# Patient Record
Sex: Male | Born: 2000 | Race: White | Hispanic: No | Marital: Single | Smoking: Never smoker
Health system: Southern US, Community
[De-identification: ages and names within clinical notes are randomized; demographics above are authoritative.]

## PROBLEM LIST (undated history)

## (undated) DIAGNOSIS — M419 Scoliosis, unspecified: Secondary | ICD-10-CM

---
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
## 2018-10-11 IMAGING — CT CT ABD-PELV W/ CM
2 of 4 series · 15 of 46 positions shown, 17 images · IV contrast (Omni 300)
Comparison: Ultrasounds yesterday.

CLINICAL DATA: Right lower quadrant abdominal pain.

EXAM:
CT ABDOMEN AND PELVIS WITH CONTRAST
TECHNIQUE: Multidetector CT imaging of the abdomen and pelvis was performed
using the standard protocol following bolus administration of
intravenous contrast.
CONTRAST:  100mL LXJ7I7-DJJ IOPAMIDOL (LXJ7I7-DJJ) INJECTION 61%

[Series 3: a/p w/ 5mm · axial · 0.69mm/px · z∈[-517,-92]mm · 12 of 97 slices shown, 14 images]
[im 6/97  soft-tissue]
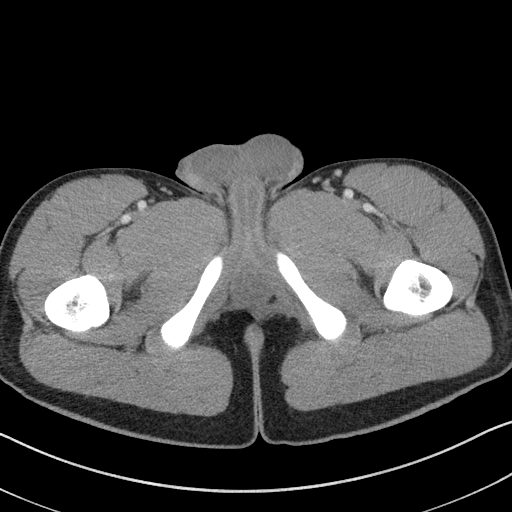
[im 6/97  bone]
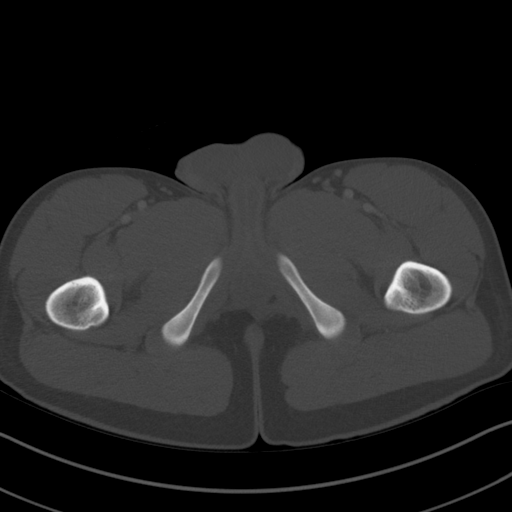
[im 16/97  soft-tissue]
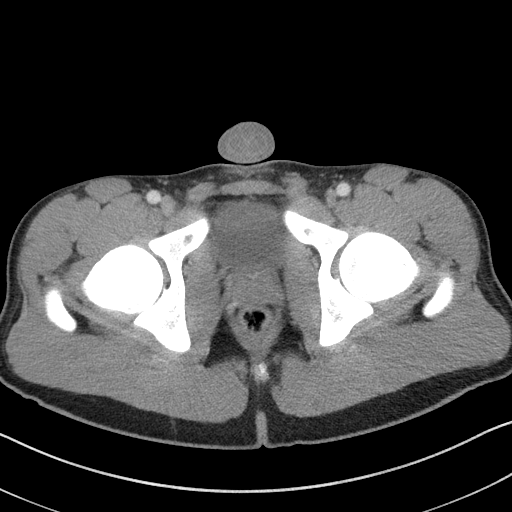
[im 21/97  soft-tissue]
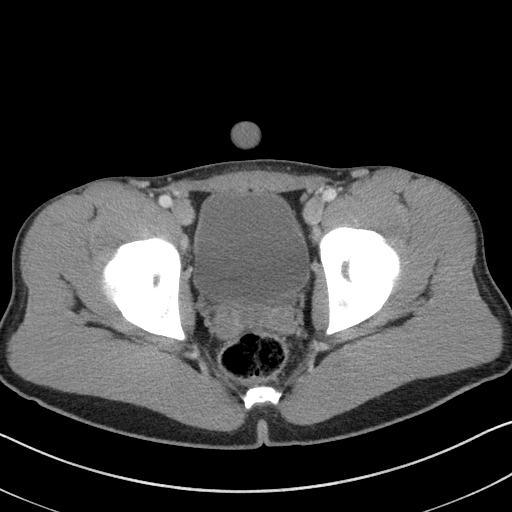
[im 31/97  soft-tissue]
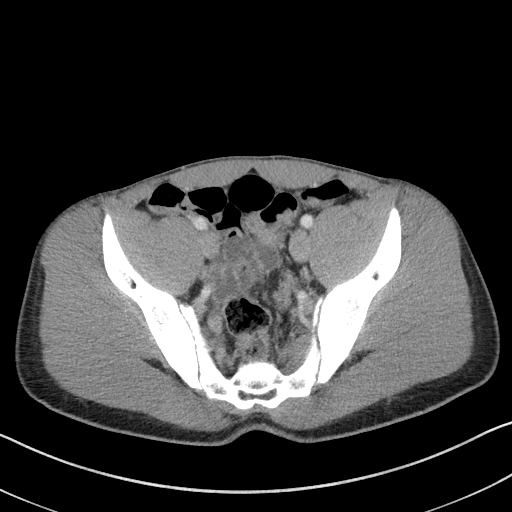
[im 36/97  soft-tissue]
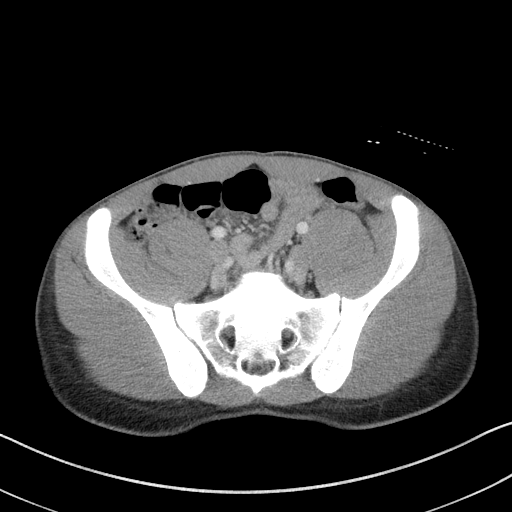
[im 46/97  soft-tissue]
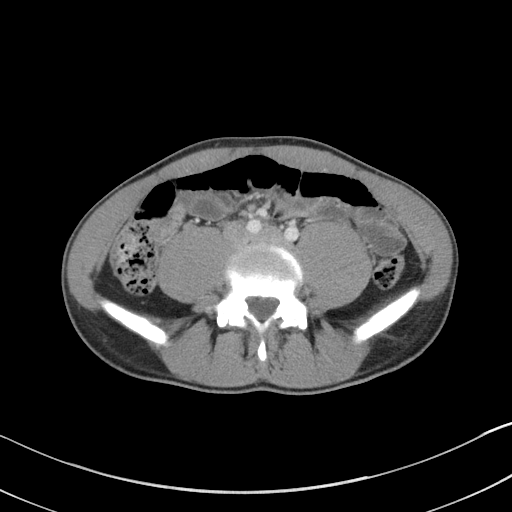
[im 51/97  soft-tissue]
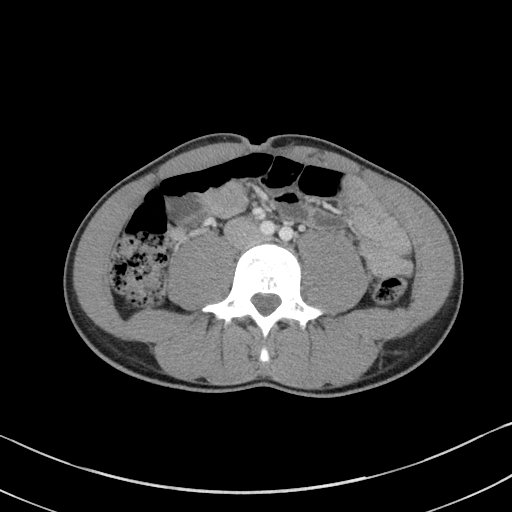
[im 61/97  soft-tissue]
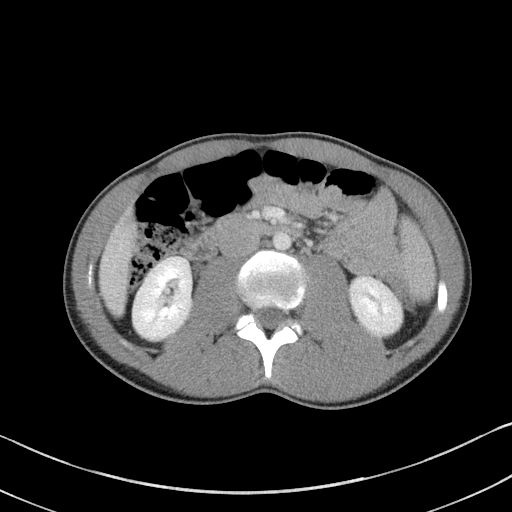
[im 66/97  soft-tissue]
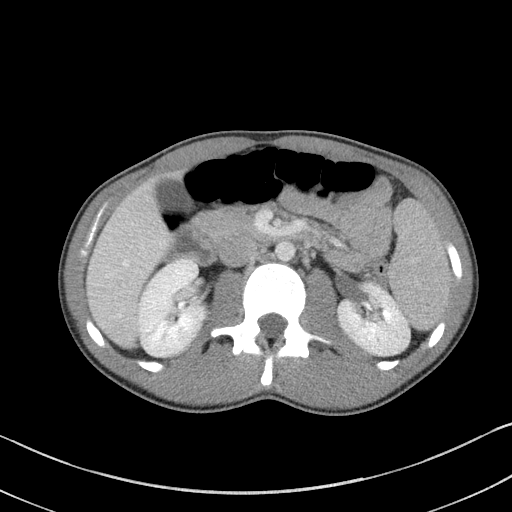
[im 66/97  bone]
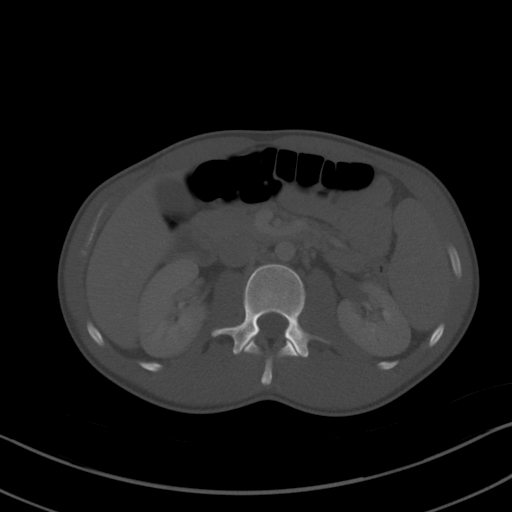
[im 76/97  soft-tissue]
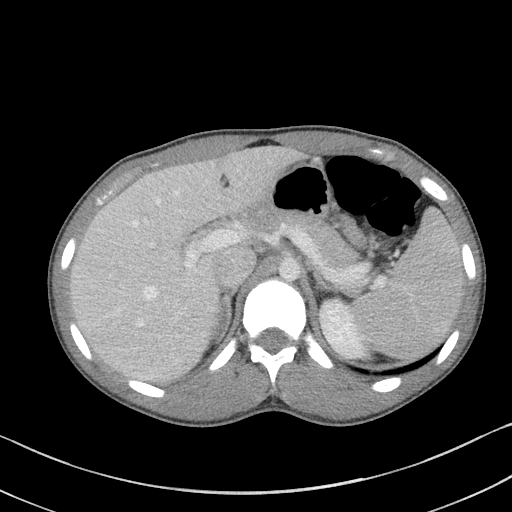
[im 81/97  soft-tissue]
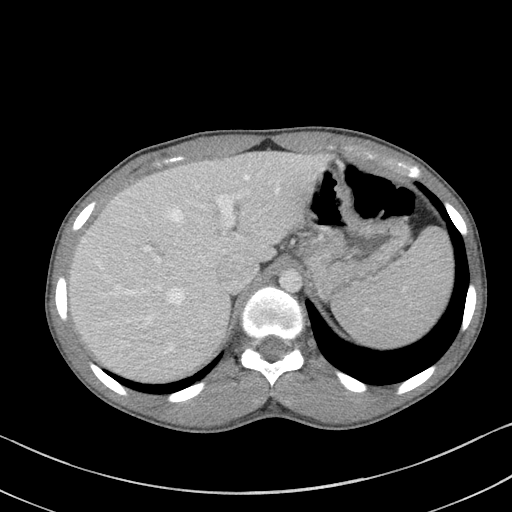
[im 91/97  soft-tissue]
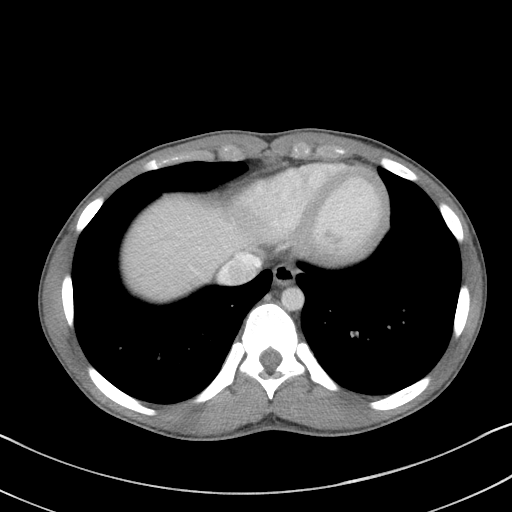

[Series 6: a/p w/ cor · coronal · 0.71mm/px · 3 of 103 slices shown]
[im 35/103  soft-tissue]
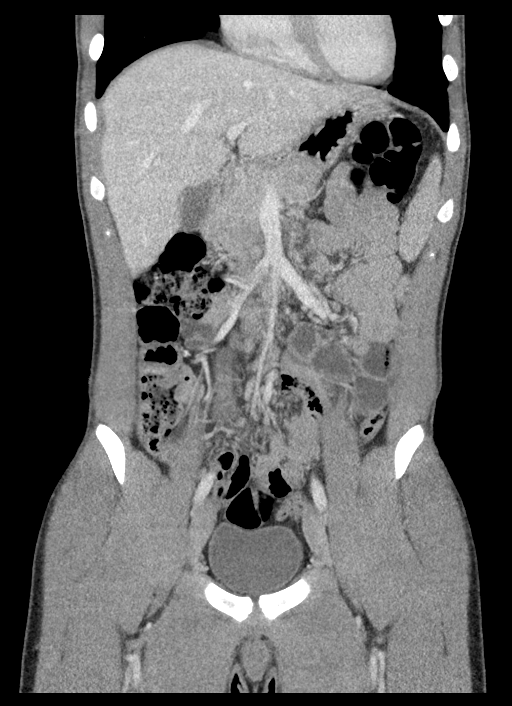
[im 46/103  soft-tissue]
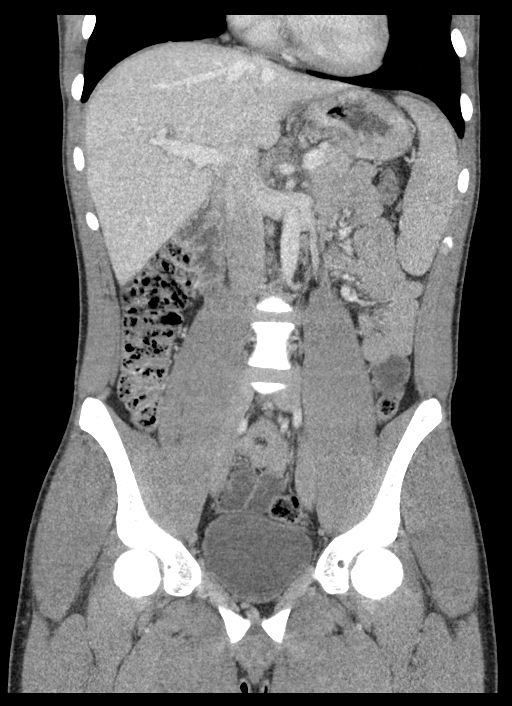
[im 57/103  soft-tissue]
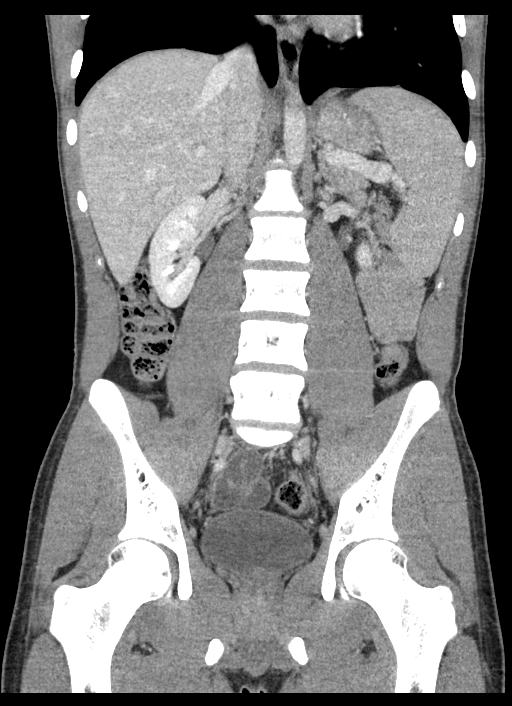

[15 of 46 positions shown; findings below may reference images not displayed]

FINDINGS: Lower chest: The lung bases are clear.

Hepatobiliary: No focal liver abnormality is seen. No gallstones,
gallbladder wall thickening, or biliary dilatation.

Pancreas: No ductal dilatation or inflammation.

Spleen: Mild splenomegaly. Spleen measures 13.9 x 5.8 x 10.7 cm
(volume = 450 cm^3). No focal lesion.

Adrenals/Urinary Tract: No adrenal nodule. No hydronephrosis or
perinephric edema. Homogeneous symmetric renal enhancement. Ureters
are decompressed. Urinary bladder is physiologically distended. No
bladder stone.

Stomach/Bowel: Stomach is decompressed. Small volume of stool
throughout the colon. Pelvic bowel loops are suboptimally assessed
given lack of enteric contrast and paucity of intra-abdominal fat.
There are fluid-filled nondilated pelvic small bowel loops. No
terminal ileal inflammation. Portions of the appendix are
tentatively but not definitively identified and normal. There is no
discrete pericecal or right lower quadrant inflammation. Small
volume of free fluid is in the central pelvis.

Vascular/Lymphatic: Prominent central mesenteric and ileocolic
nodes. No retroperitoneal adenopathy. No vascular findings are
present.

Reproductive: Normal sized prostate gland.

Other: Small volume of free fluid in the pelvis. No loculated
abscess. No free air.

Musculoskeletal: There are no acute or suspicious osseous
abnormalities.
IMPRESSION: 1. No evidence of appendicitis. The appendix is not well seen,
portions are tentatively identified. There is no discrete pericecal
or right lower quadrant inflammation.
2. There are prominent mesenteric and ileocolic nodes. Mild
splenomegaly. Constellation of findings suggest meesnteric adenitis.
3. Small amount of nonspecific pelvic free fluid, likely reactive.

## 2019-02-12 IMAGING — US US ABDOMEN LIMITED
1 series · 8 of 8 positions shown · non-contrast
Comparison: None.

CLINICAL DATA: Acute onset of right lower quadrant abdominal pain.
Initial encounter.

EXAM:
LIMITED ABDOMINAL ULTRASOUND
TECHNIQUE: Gray scale imaging of the right lower quadrant was performed to
evaluate for suspected appendicitis. Standard imaging planes and
graded compression technique were utilized.

[Series 1: us abdomen limited · 0.12mm/px · 8 of 8 slices shown]
[im 1/8]
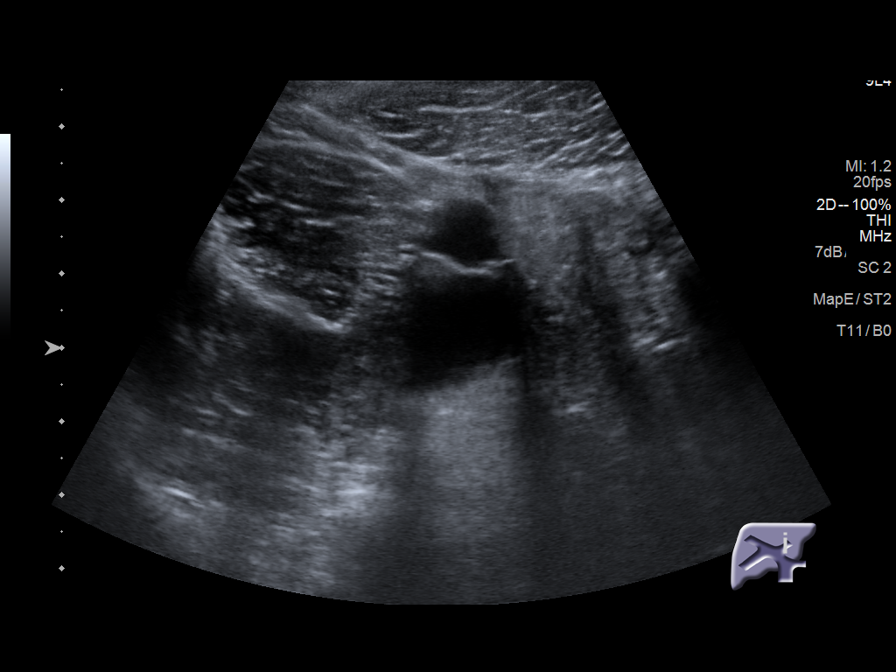
[im 2/8]
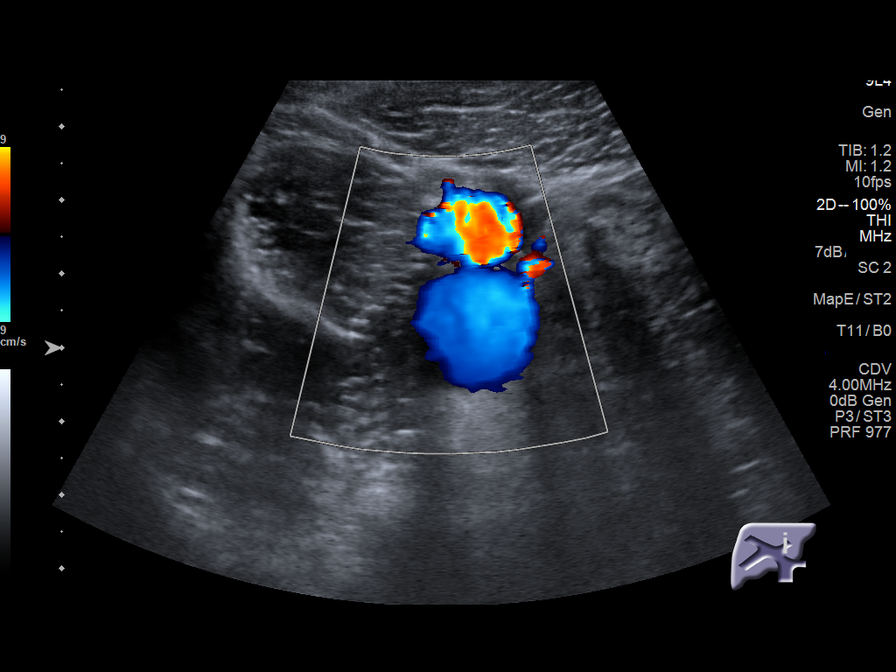
[im 3/8]
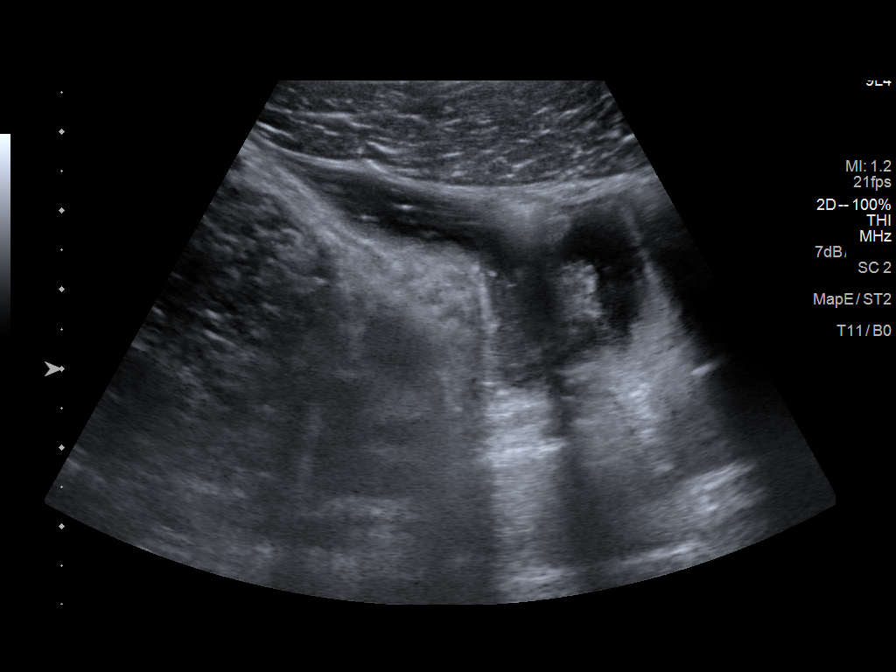
[im 4/8]
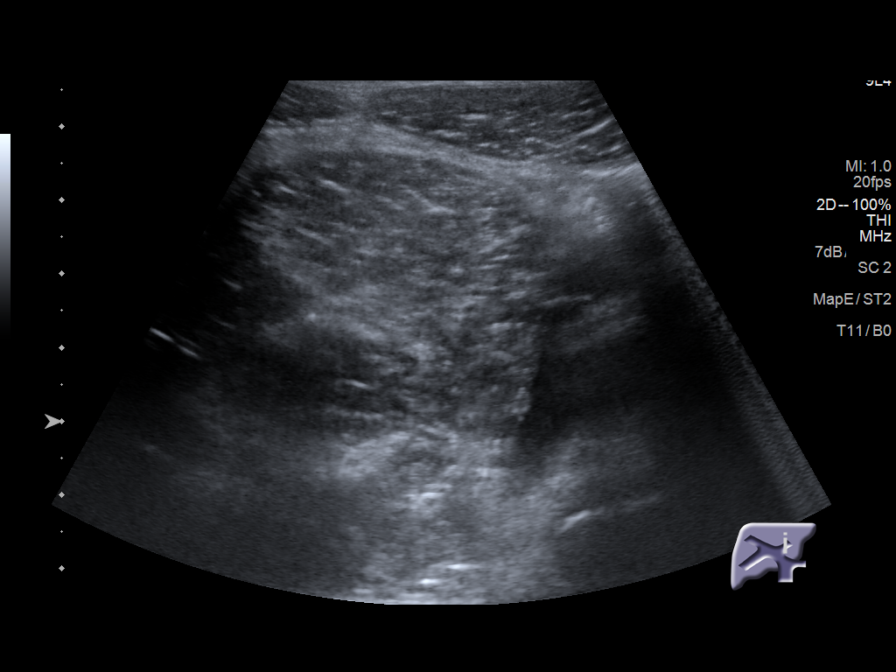
[im 5/8]
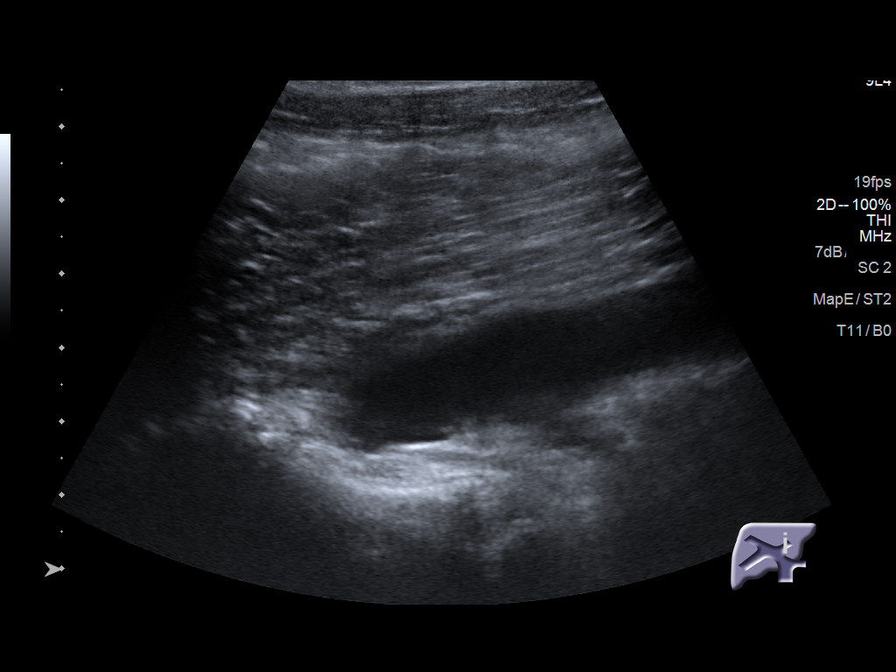
[im 6/8]
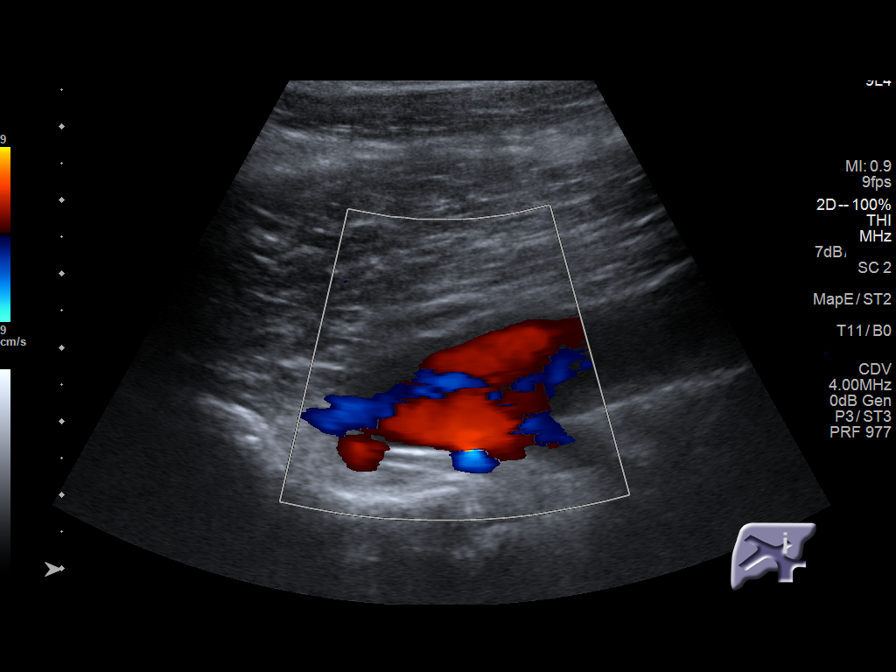
[im 7/8]
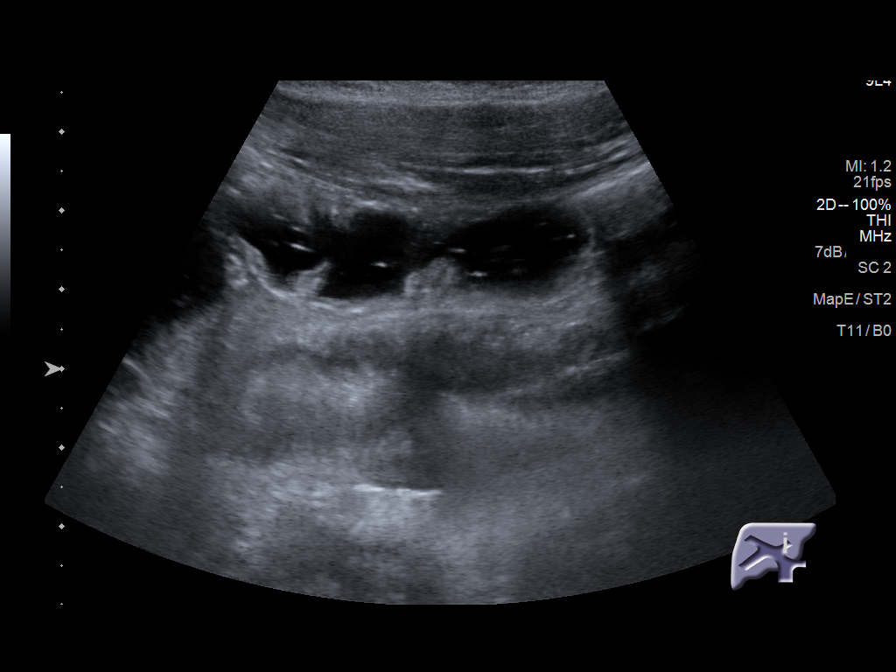
[im 8/8]
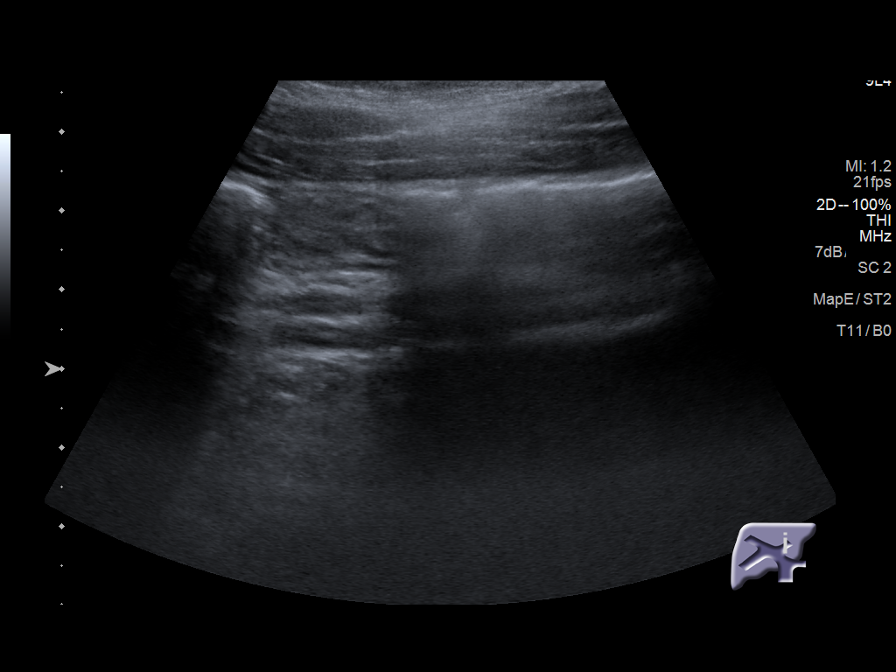

[8 of 8 positions shown; findings below may reference images not displayed]

FINDINGS: The appendix is not visualized.

Ancillary findings: None.

Factors affecting image quality: None.
IMPRESSION: No abnormal appendix, focal fluid collection or other focal
abnormality seen.

Note: Non-visualization of appendix by US does not definitely
exclude appendicitis. If there is sufficient clinical concern,
consider abdomen pelvis CT with contrast for further evaluation.

## 2019-02-12 IMAGING — US US ABDOMEN COMPLETE
1 series · 14 of 25 positions shown · non-contrast
Comparison: None.

CLINICAL DATA: 15 y/o  M; epigastric pain.

EXAM:
ABDOMEN ULTRASOUND COMPLETE

[Series 1: us abdomen complete · 0.19mm/px · 14 of 96 slices shown]
[im 1/96]
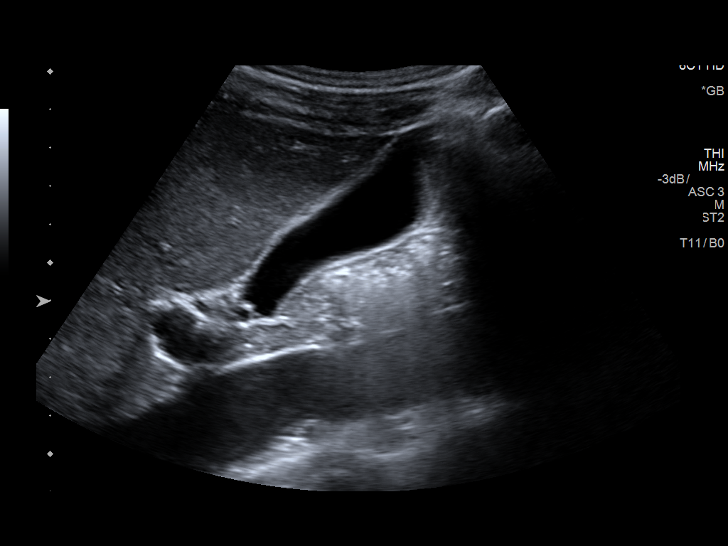
[im 8/96]
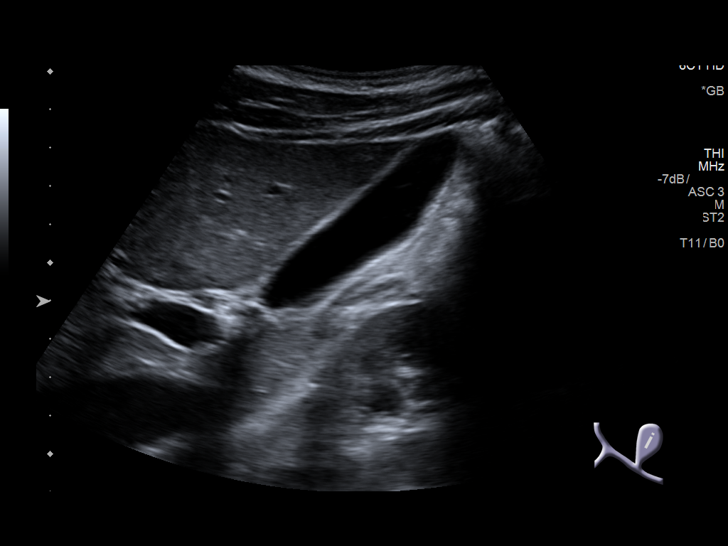
[im 16/96]
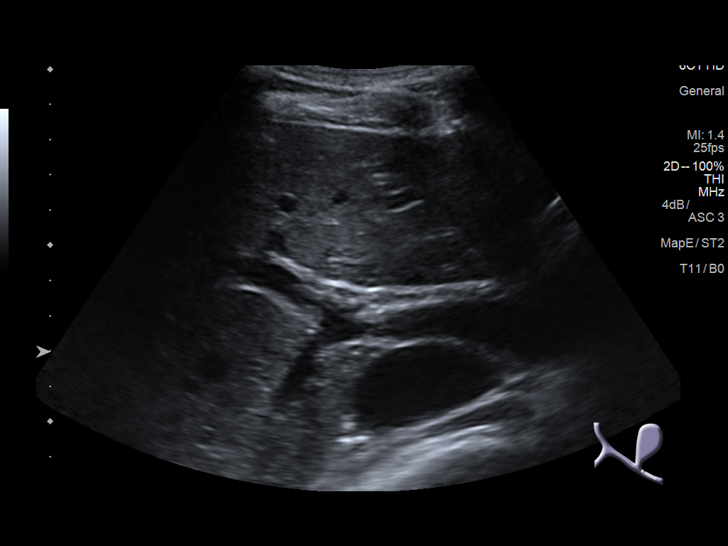
[im 24/96]
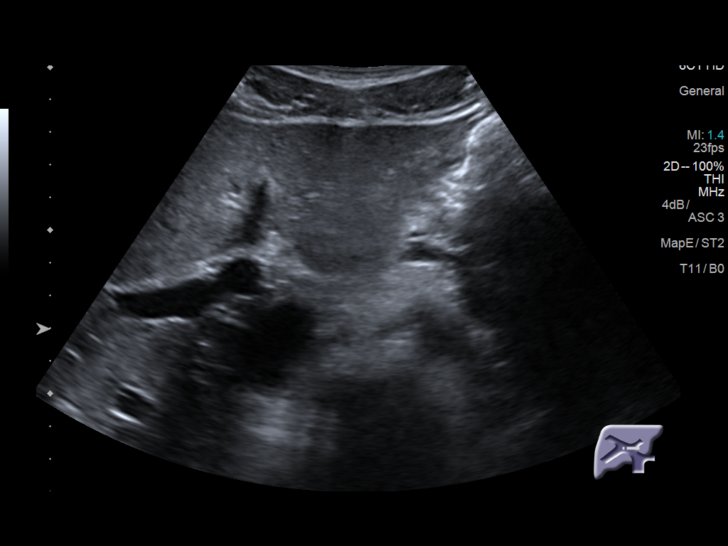
[im 32/96]
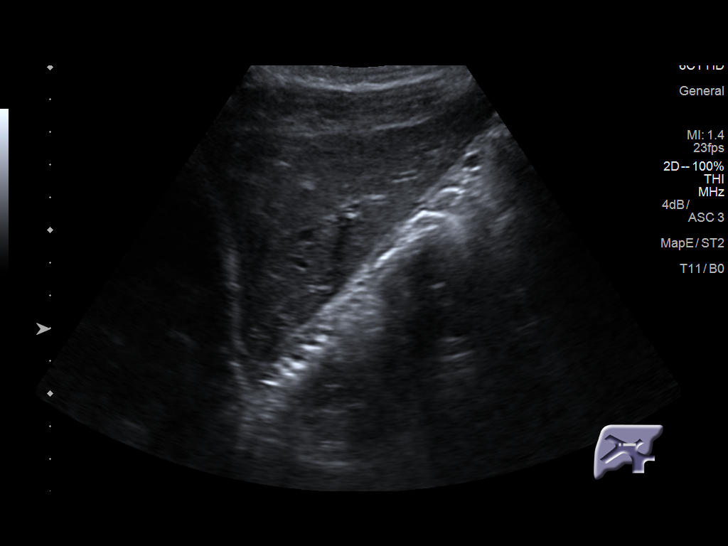
[im 36/96]
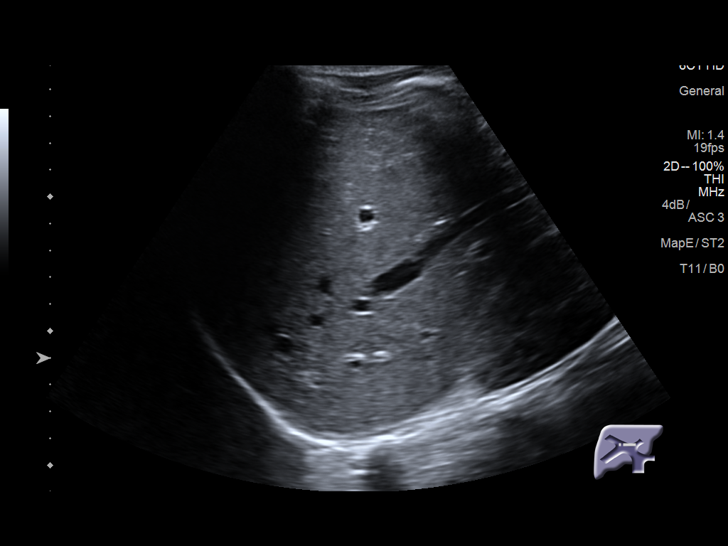
[im 44/96]
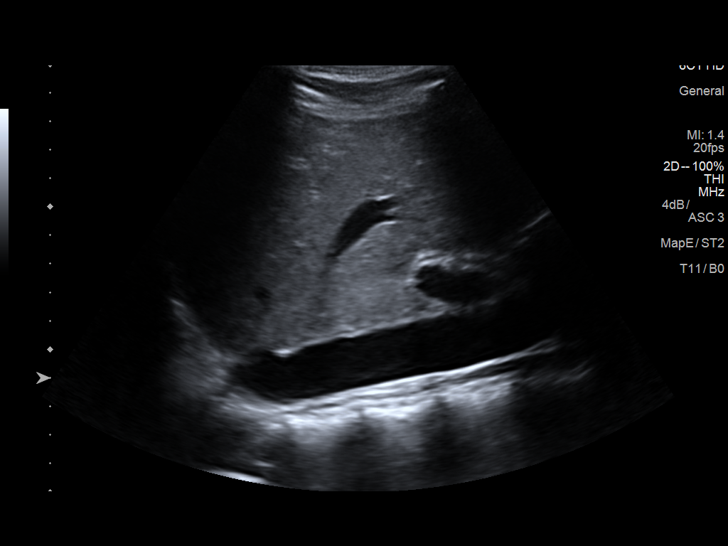
[im 52/96]
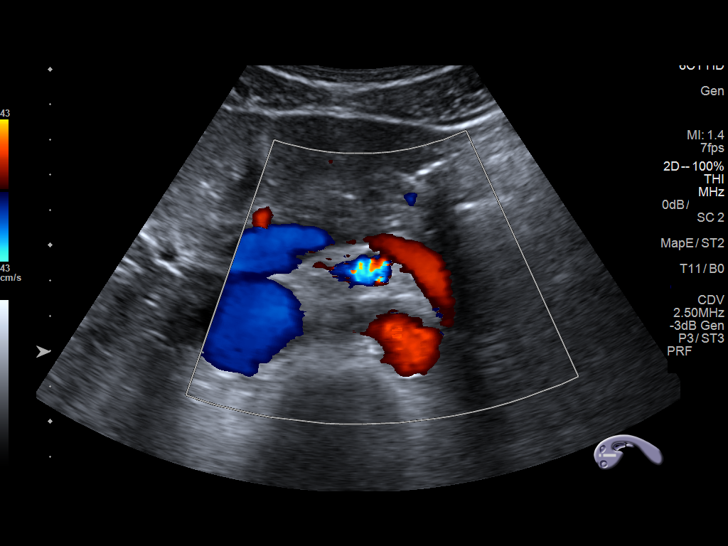
[im 60/96]
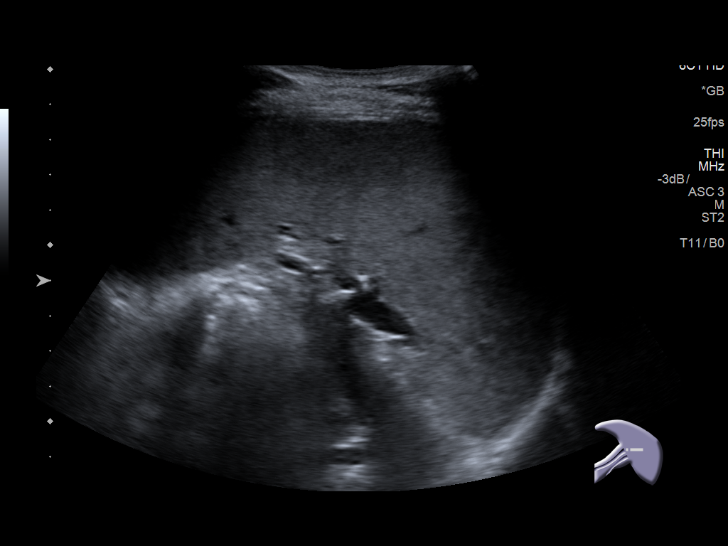
[im 64/96]
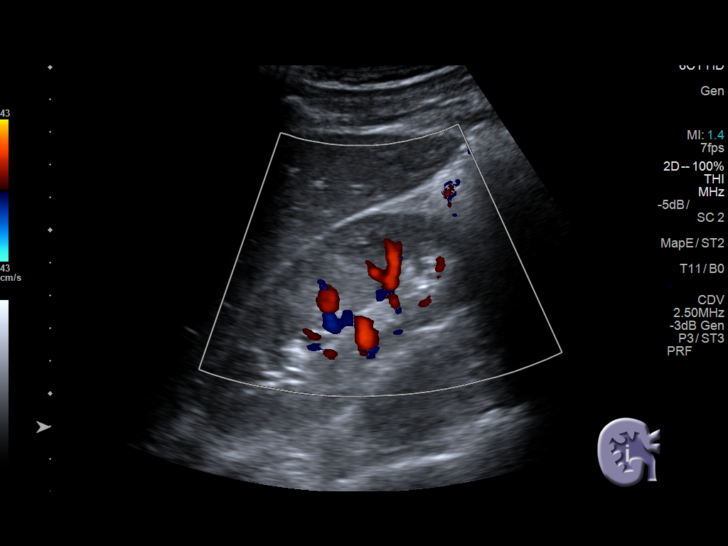
[im 72/96]
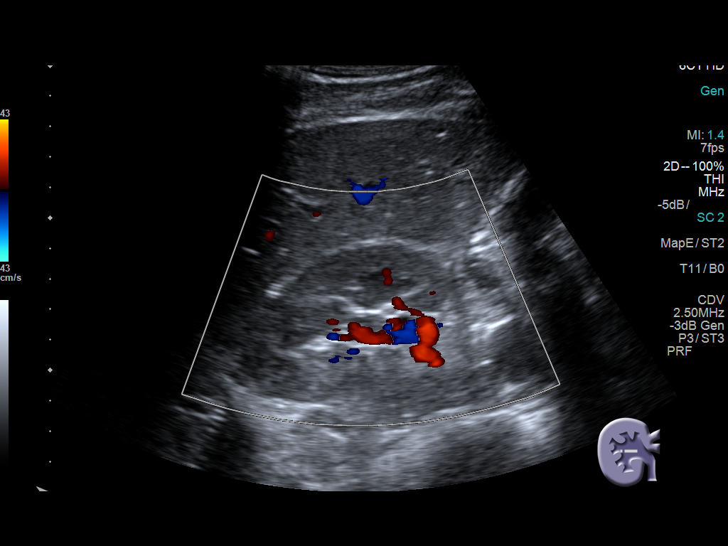
[im 80/96]
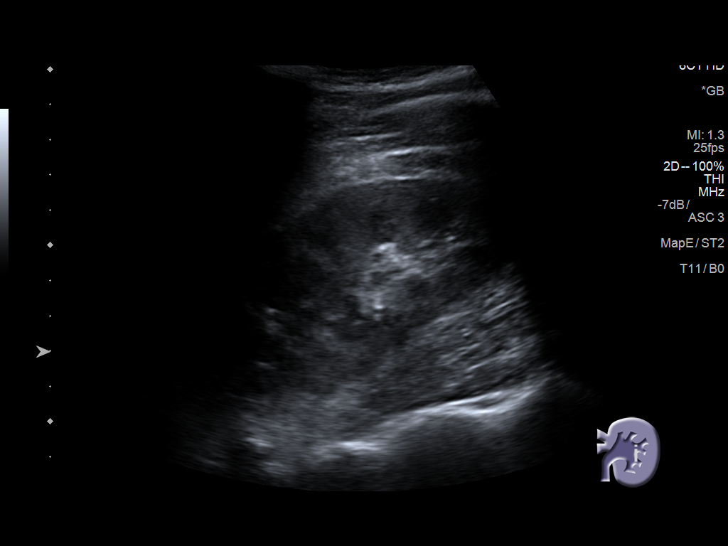
[im 88/96]
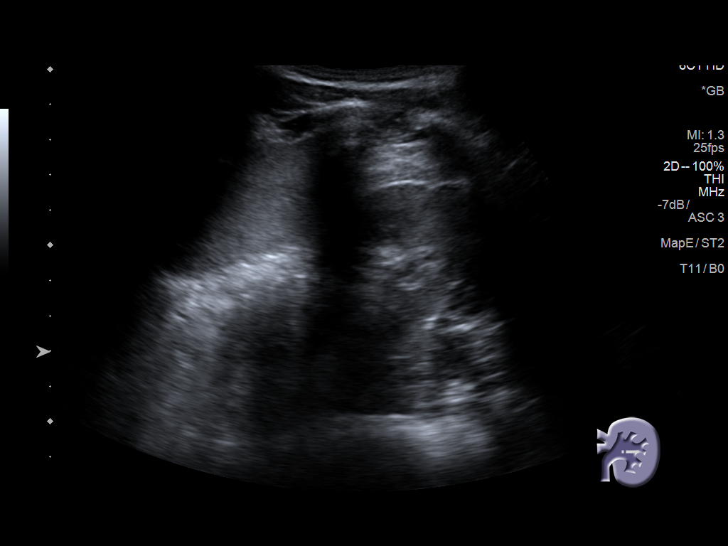
[im 96/96]
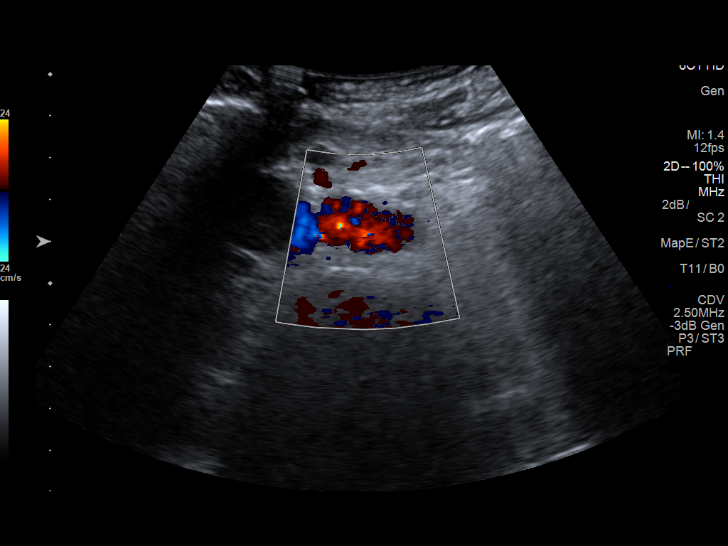

[14 of 25 positions shown; findings below may reference images not displayed]

FINDINGS: Gallbladder: No gallstones or wall thickening visualized. No
sonographic Murphy sign noted by sonographer.

Common bile duct: Diameter: 3 mm

Liver: No focal lesion identified. Within normal limits in
parenchymal echogenicity.

IVC: No abnormality visualized.

Pancreas: Visualized portion unremarkable.

Spleen: Enlarged spleen measuring 14.3 mm.

Right Kidney: Length: 10.5 cm. Echogenicity within normal limits. No
mass or hydronephrosis visualized.

Left Kidney: Length: 10.4 cm. Echogenicity within normal limits. No
mass or hydronephrosis visualized.

Abdominal aorta: No aneurysm visualized.

Other findings: None.
IMPRESSION: 1. Moderate splenomegaly measuring up to 14.3 mm.
2. Otherwise unremarkable abdominal ultrasound.

By: Julius Bellanger M.D.
